# Patient Record
Sex: Female | Born: 1980 | Race: White | Hispanic: No | Marital: Married
Health system: Southern US, Community
[De-identification: ages and names within clinical notes are randomized; demographics above are authoritative.]

## PROBLEM LIST (undated history)

## (undated) HISTORY — PX: APPENDECTOMY: SHX54

---

## 2000-02-28 ENCOUNTER — Emergency Department (HOSPITAL_COMMUNITY): Admission: EM | Admit: 2000-02-28 | Discharge: 2000-02-28 | Payer: Self-pay | Admitting: Emergency Medicine

## 2004-11-19 ENCOUNTER — Other Ambulatory Visit: Admission: RE | Admit: 2004-11-19 | Discharge: 2004-11-19 | Payer: Self-pay | Admitting: Obstetrics and Gynecology

## 2005-05-25 ENCOUNTER — Inpatient Hospital Stay (HOSPITAL_COMMUNITY): Admission: AD | Admit: 2005-05-25 | Discharge: 2005-05-27 | Payer: Self-pay | Admitting: Obstetrics and Gynecology

## 2008-02-20 ENCOUNTER — Ambulatory Visit (HOSPITAL_COMMUNITY): Admission: RE | Admit: 2008-02-20 | Discharge: 2008-02-20 | Payer: Self-pay | Admitting: Family Medicine

## 2010-02-09 ENCOUNTER — Ambulatory Visit: Payer: Self-pay | Admitting: Plastic Surgery

## 2010-09-21 ENCOUNTER — Ambulatory Visit (HOSPITAL_COMMUNITY)
Admission: RE | Admit: 2010-09-21 | Discharge: 2010-09-21 | Payer: Self-pay | Source: Home / Self Care | Attending: Family Medicine | Admitting: Family Medicine

## 2011-02-19 NOTE — Discharge Summary (Signed)
NAMESHAKARIA, Donna Donovan             ACCOUNT NO.:  [id number]   MEDICAL RECORD NO.:  [id number]          PATIENT TYPE:  INP   LOCATION:  9117                          FACILITY:  [facility]   PHYSICIAN:  Huel Cote, M.D. DATE OF BIRTH:  21-Jan-1981   DATE OF ADMISSION:  05/25/2005  DATE OF DISCHARGE:  05/27/2005                                 DISCHARGE SUMMARY   DISCHARGE DIAGNOSES:  1.  Term pregnancy at 39+ weeks.  2.  Status post normal spontaneous vaginal delivery.  3.  Mild postpartum hemorrhage responding to Methergine.   DISCHARGE MEDICATIONS:  1.  Motrin 600 mg p.o. every six hours.  2.  Percocet one to two tablets p.o. every four hours p.r.n.   DISCHARGE FOLLOW-UP:  The patient will follow up in the office in  approximately six weeks for her routine postpartum exam.   The patient is a 30 year old G1, P0, who was admitted at 39+ weeks'  gestation for social induction given term status, favorable cervix, and her  husband working out of town in [place], [place], with a need to  schedule when he could be present.  Prenatal labs are as follows:  O  positive, antibody negative.  Rubella immune.  Hepatitis B surface antigen  negative.  HIV negative.  GC and Chlamydia negative.  One-hour Glucola 145,  three-hour Glucola normal.  Group B strep negative.   PAST OBSTETRICAL HISTORY:  None.   PAST GYNECOLOGIC HISTORY:  None.   PAST SURGICAL HISTORY:  Appendectomy in 1990.   PAST MEDICAL HISTORY:  None.  The patient had a history of smoking, however  quit with pregnancy.   PHYSICAL EXAMINATION:  She was afebrile with stable vital signs on  admission.  Fetal heart rate was reactive.  Cervix was soft, 50, 1-2 cm, and  a -2 station.   She had rupture of membranes performed with clear fluid noted.  She  progressed in labor throughout the day and reached complete dilation and  pushed well for approximately 20 minutes with a normal spontaneous vaginal  delivery of a vigorous  female infant over a small first degree laceration.  Apgars were 8 and 9.  Weight was 7 pounds 9 ounces.  Placenta delivered  spontaneously.  Uterus had moderate atony, which responded to bimanual  massage and Methergine x1 with an estimated blood loss of right at 500 mL.  Her laceration was repaired with 2-0  Vicryl and the cervix and rectum were intact.  On postpartum day #1 she was  doing quite well.  Her hemoglobin was 10.6.  Her fundus was firm and her  bleeding completely normal.  On postpartum day #2 she again was doing well  and was afebrile with stable vital signs and felt stable for discharge.      Huel Cote, M.D.  Electronically Signed     KR/MEDQ  D:  07/01/2005  T:  07/02/2005  Job:  [id number]

## 2012-10-04 HISTORY — PX: AUGMENTATION MAMMAPLASTY: SUR837

## 2016-11-08 DIAGNOSIS — Z1389 Encounter for screening for other disorder: Secondary | ICD-10-CM | POA: Diagnosis not present

## 2016-11-08 DIAGNOSIS — Z6829 Body mass index (BMI) 29.0-29.9, adult: Secondary | ICD-10-CM | POA: Diagnosis not present

## 2016-11-08 DIAGNOSIS — E663 Overweight: Secondary | ICD-10-CM | POA: Diagnosis not present

## 2016-11-08 DIAGNOSIS — F909 Attention-deficit hyperactivity disorder, unspecified type: Secondary | ICD-10-CM | POA: Diagnosis not present

## 2017-02-02 DIAGNOSIS — Z6830 Body mass index (BMI) 30.0-30.9, adult: Secondary | ICD-10-CM | POA: Diagnosis not present

## 2017-02-02 DIAGNOSIS — Z1389 Encounter for screening for other disorder: Secondary | ICD-10-CM | POA: Diagnosis not present

## 2017-02-02 DIAGNOSIS — F909 Attention-deficit hyperactivity disorder, unspecified type: Secondary | ICD-10-CM | POA: Diagnosis not present

## 2017-02-02 DIAGNOSIS — E6609 Other obesity due to excess calories: Secondary | ICD-10-CM | POA: Diagnosis not present

## 2017-05-16 DIAGNOSIS — F909 Attention-deficit hyperactivity disorder, unspecified type: Secondary | ICD-10-CM | POA: Diagnosis not present

## 2017-05-16 DIAGNOSIS — Z681 Body mass index (BMI) 19 or less, adult: Secondary | ICD-10-CM | POA: Diagnosis not present

## 2017-05-16 DIAGNOSIS — Z1389 Encounter for screening for other disorder: Secondary | ICD-10-CM | POA: Diagnosis not present

## 2017-08-17 DIAGNOSIS — F909 Attention-deficit hyperactivity disorder, unspecified type: Secondary | ICD-10-CM | POA: Diagnosis not present

## 2017-08-17 DIAGNOSIS — E6609 Other obesity due to excess calories: Secondary | ICD-10-CM | POA: Diagnosis not present

## 2017-08-17 DIAGNOSIS — Z6831 Body mass index (BMI) 31.0-31.9, adult: Secondary | ICD-10-CM | POA: Diagnosis not present

## 2017-10-10 DIAGNOSIS — Z1151 Encounter for screening for human papillomavirus (HPV): Secondary | ICD-10-CM | POA: Diagnosis not present

## 2017-10-10 DIAGNOSIS — Z30432 Encounter for removal of intrauterine contraceptive device: Secondary | ICD-10-CM | POA: Diagnosis not present

## 2017-10-10 DIAGNOSIS — Z6831 Body mass index (BMI) 31.0-31.9, adult: Secondary | ICD-10-CM | POA: Diagnosis not present

## 2017-10-10 DIAGNOSIS — Z01419 Encounter for gynecological examination (general) (routine) without abnormal findings: Secondary | ICD-10-CM | POA: Diagnosis not present

## 2017-10-10 DIAGNOSIS — Z1389 Encounter for screening for other disorder: Secondary | ICD-10-CM | POA: Diagnosis not present

## 2017-10-10 DIAGNOSIS — Z124 Encounter for screening for malignant neoplasm of cervix: Secondary | ICD-10-CM | POA: Diagnosis not present

## 2017-10-10 DIAGNOSIS — Z130 Encounter for screening for diseases of the blood and blood-forming organs and certain disorders involving the immune mechanism: Secondary | ICD-10-CM | POA: Diagnosis not present

## 2017-10-10 DIAGNOSIS — Z30430 Encounter for insertion of intrauterine contraceptive device: Secondary | ICD-10-CM | POA: Diagnosis not present

## 2017-10-17 DIAGNOSIS — Z1389 Encounter for screening for other disorder: Secondary | ICD-10-CM | POA: Diagnosis not present

## 2017-10-17 DIAGNOSIS — M545 Low back pain: Secondary | ICD-10-CM | POA: Diagnosis not present

## 2017-10-17 DIAGNOSIS — Z681 Body mass index (BMI) 19 or less, adult: Secondary | ICD-10-CM | POA: Diagnosis not present

## 2017-10-17 DIAGNOSIS — M5410 Radiculopathy, site unspecified: Secondary | ICD-10-CM | POA: Diagnosis not present

## 2017-11-15 DIAGNOSIS — Z30431 Encounter for routine checking of intrauterine contraceptive device: Secondary | ICD-10-CM | POA: Diagnosis not present

## 2017-11-19 DIAGNOSIS — H1013 Acute atopic conjunctivitis, bilateral: Secondary | ICD-10-CM | POA: Diagnosis not present

## 2017-11-19 DIAGNOSIS — H40033 Anatomical narrow angle, bilateral: Secondary | ICD-10-CM | POA: Diagnosis not present

## 2017-11-28 DIAGNOSIS — Z6833 Body mass index (BMI) 33.0-33.9, adult: Secondary | ICD-10-CM | POA: Diagnosis not present

## 2017-11-28 DIAGNOSIS — F909 Attention-deficit hyperactivity disorder, unspecified type: Secondary | ICD-10-CM | POA: Diagnosis not present

## 2017-11-28 DIAGNOSIS — Z1389 Encounter for screening for other disorder: Secondary | ICD-10-CM | POA: Diagnosis not present

## 2017-11-28 DIAGNOSIS — E6609 Other obesity due to excess calories: Secondary | ICD-10-CM | POA: Diagnosis not present

## 2018-03-09 DIAGNOSIS — F909 Attention-deficit hyperactivity disorder, unspecified type: Secondary | ICD-10-CM | POA: Diagnosis not present

## 2018-03-09 DIAGNOSIS — Z6832 Body mass index (BMI) 32.0-32.9, adult: Secondary | ICD-10-CM | POA: Diagnosis not present

## 2018-03-09 DIAGNOSIS — E6609 Other obesity due to excess calories: Secondary | ICD-10-CM | POA: Diagnosis not present

## 2018-03-09 DIAGNOSIS — Z1389 Encounter for screening for other disorder: Secondary | ICD-10-CM | POA: Diagnosis not present

## 2018-06-08 DIAGNOSIS — F909 Attention-deficit hyperactivity disorder, unspecified type: Secondary | ICD-10-CM | POA: Diagnosis not present

## 2018-06-08 DIAGNOSIS — Z6833 Body mass index (BMI) 33.0-33.9, adult: Secondary | ICD-10-CM | POA: Diagnosis not present

## 2018-06-08 DIAGNOSIS — E6609 Other obesity due to excess calories: Secondary | ICD-10-CM | POA: Diagnosis not present

## 2018-06-08 DIAGNOSIS — Z1389 Encounter for screening for other disorder: Secondary | ICD-10-CM | POA: Diagnosis not present

## 2018-10-27 DIAGNOSIS — Z681 Body mass index (BMI) 19 or less, adult: Secondary | ICD-10-CM | POA: Diagnosis not present

## 2018-10-27 DIAGNOSIS — Z1389 Encounter for screening for other disorder: Secondary | ICD-10-CM | POA: Diagnosis not present

## 2018-10-27 DIAGNOSIS — F909 Attention-deficit hyperactivity disorder, unspecified type: Secondary | ICD-10-CM | POA: Diagnosis not present

## 2019-02-08 DIAGNOSIS — F909 Attention-deficit hyperactivity disorder, unspecified type: Secondary | ICD-10-CM | POA: Diagnosis not present

## 2019-05-16 DIAGNOSIS — Z6833 Body mass index (BMI) 33.0-33.9, adult: Secondary | ICD-10-CM | POA: Diagnosis not present

## 2019-05-16 DIAGNOSIS — F909 Attention-deficit hyperactivity disorder, unspecified type: Secondary | ICD-10-CM | POA: Diagnosis not present

## 2019-05-16 DIAGNOSIS — M5410 Radiculopathy, site unspecified: Secondary | ICD-10-CM | POA: Diagnosis not present

## 2019-09-03 DIAGNOSIS — F909 Attention-deficit hyperactivity disorder, unspecified type: Secondary | ICD-10-CM | POA: Diagnosis not present

## 2019-09-17 DIAGNOSIS — T8549XA Other mechanical complication of breast prosthesis and implant, initial encounter: Secondary | ICD-10-CM | POA: Diagnosis not present

## 2019-09-18 ENCOUNTER — Other Ambulatory Visit: Payer: Self-pay | Admitting: Obstetrics and Gynecology

## 2019-09-18 DIAGNOSIS — T8549XA Other mechanical complication of breast prosthesis and implant, initial encounter: Secondary | ICD-10-CM

## 2019-09-21 ENCOUNTER — Other Ambulatory Visit: Payer: Self-pay | Admitting: Obstetrics and Gynecology

## 2019-09-21 DIAGNOSIS — T8543XA Leakage of breast prosthesis and implant, initial encounter: Secondary | ICD-10-CM

## 2019-09-21 DIAGNOSIS — T8549XA Other mechanical complication of breast prosthesis and implant, initial encounter: Secondary | ICD-10-CM

## 2019-09-25 ENCOUNTER — Ambulatory Visit: Admission: RE | Admit: 2019-09-25 | Payer: Self-pay | Source: Ambulatory Visit

## 2019-09-25 ENCOUNTER — Other Ambulatory Visit: Payer: Self-pay | Admitting: Obstetrics and Gynecology

## 2019-09-25 ENCOUNTER — Other Ambulatory Visit: Payer: Self-pay

## 2019-09-25 ENCOUNTER — Ambulatory Visit
Admission: RE | Admit: 2019-09-25 | Discharge: 2019-09-25 | Disposition: A | Discharge status: Home / Self Care | Payer: BC Managed Care – PPO | Source: Ambulatory Visit | Attending: Obstetrics and Gynecology | Admitting: Obstetrics and Gynecology

## 2019-09-25 DIAGNOSIS — T8543XA Leakage of breast prosthesis and implant, initial encounter: Secondary | ICD-10-CM

## 2019-09-25 DIAGNOSIS — R928 Other abnormal and inconclusive findings on diagnostic imaging of breast: Secondary | ICD-10-CM | POA: Diagnosis not present

## 2019-09-25 DIAGNOSIS — T8549XA Other mechanical complication of breast prosthesis and implant, initial encounter: Secondary | ICD-10-CM

## 2020-10-16 IMAGING — MG MM  DIGITAL DIAGNOSTIC BREAST BILAT IMPLANT W/ TOMO W/ CAD
8 of 12 series · 8 of 28 positions shown · non-contrast
Comparison: None.

CLINICAL DATA: 38-year-old female with possible right breast
implant rupture.

EXAM:
2D DIGITAL DIAGNOSTIC BILATERAL MAMMOGRAM WITH IMPLANTS, CAD AND
ADJUNCT TOMO
The patient has retroglandular implants. Standard and implant
displaced views were performed.

[R MLO]
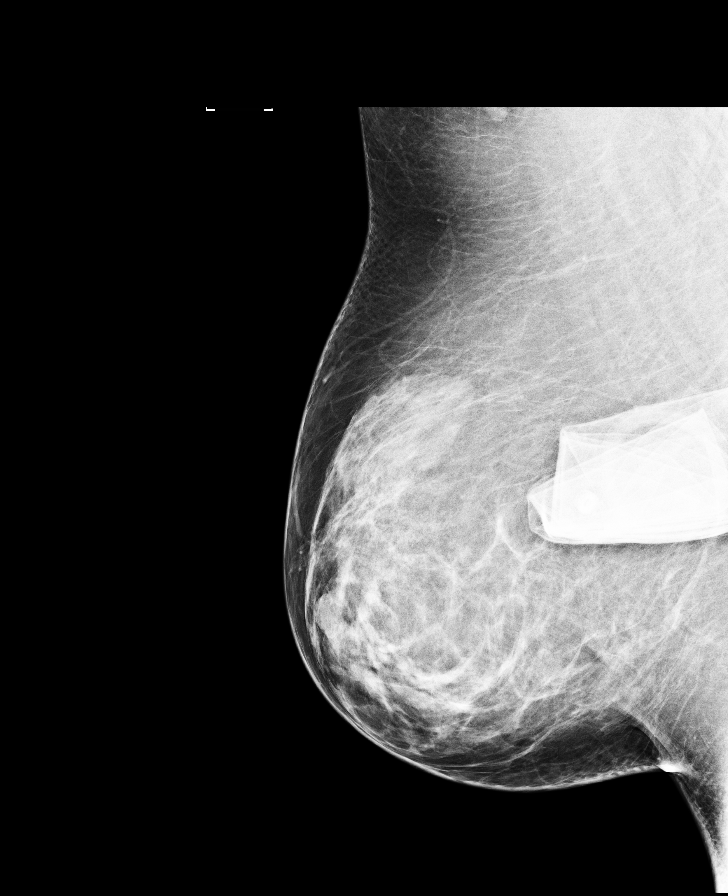

[R CC]
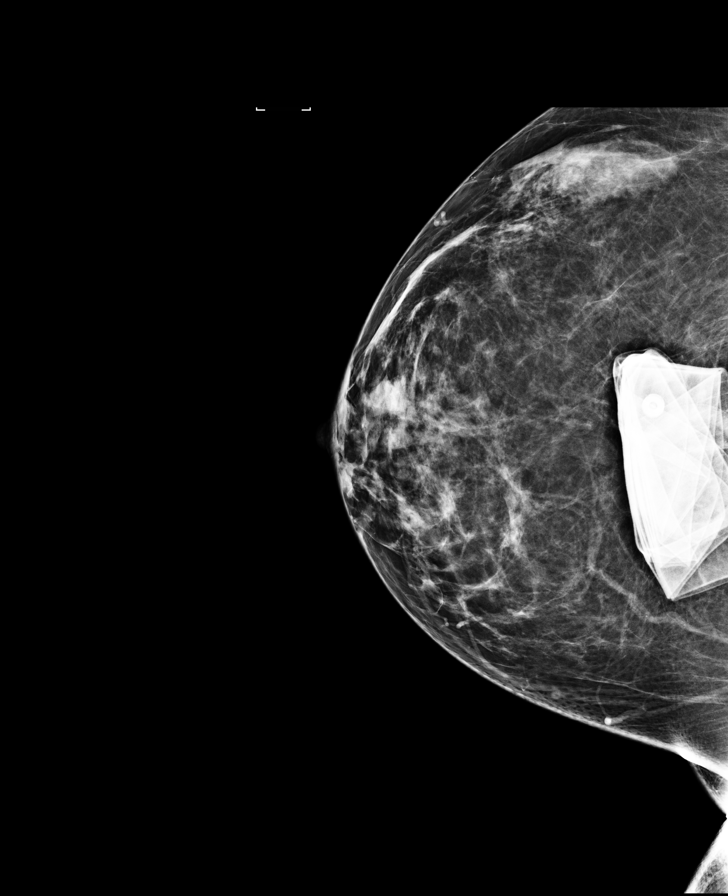

[L MLO]
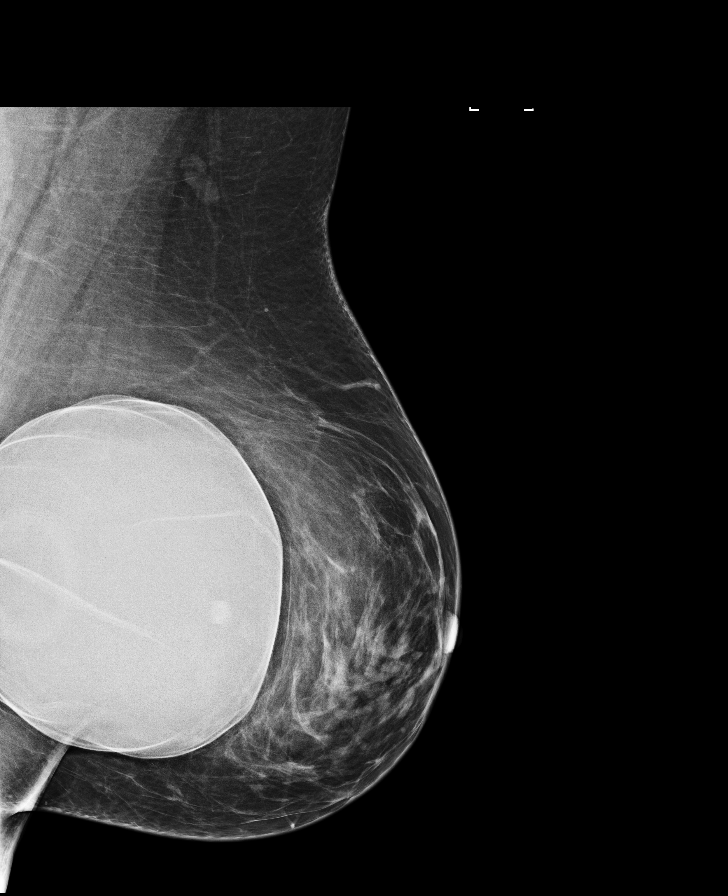

[L CC]
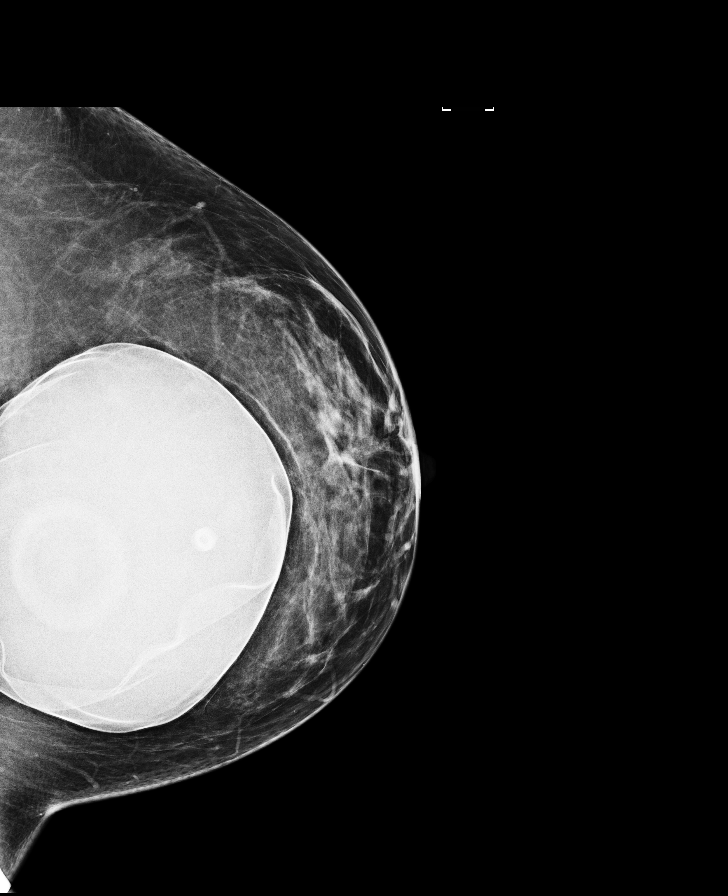

[R MLO synth-2D]
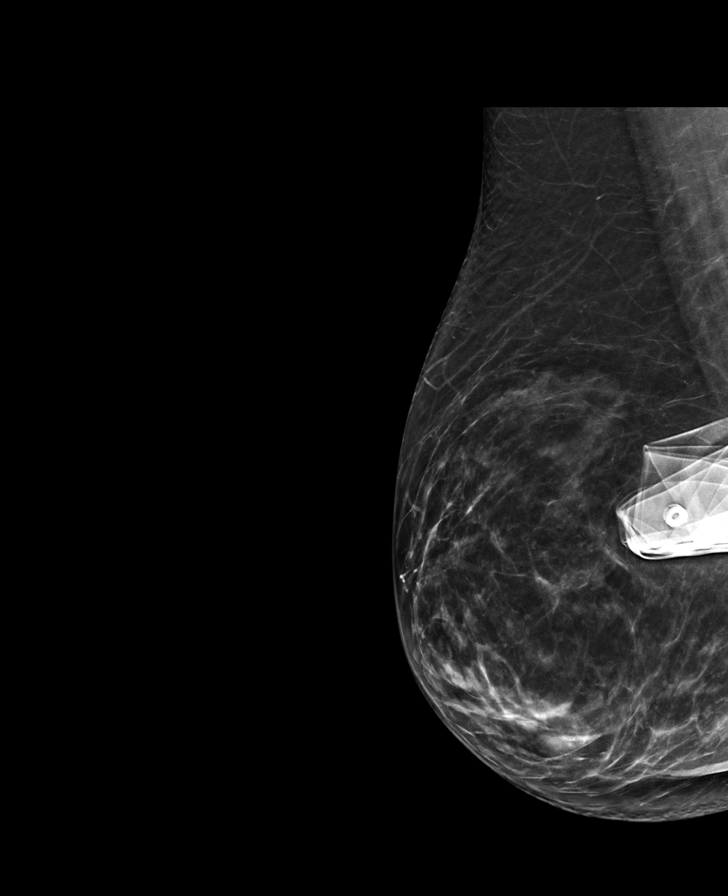

[L MLO synth-2D]
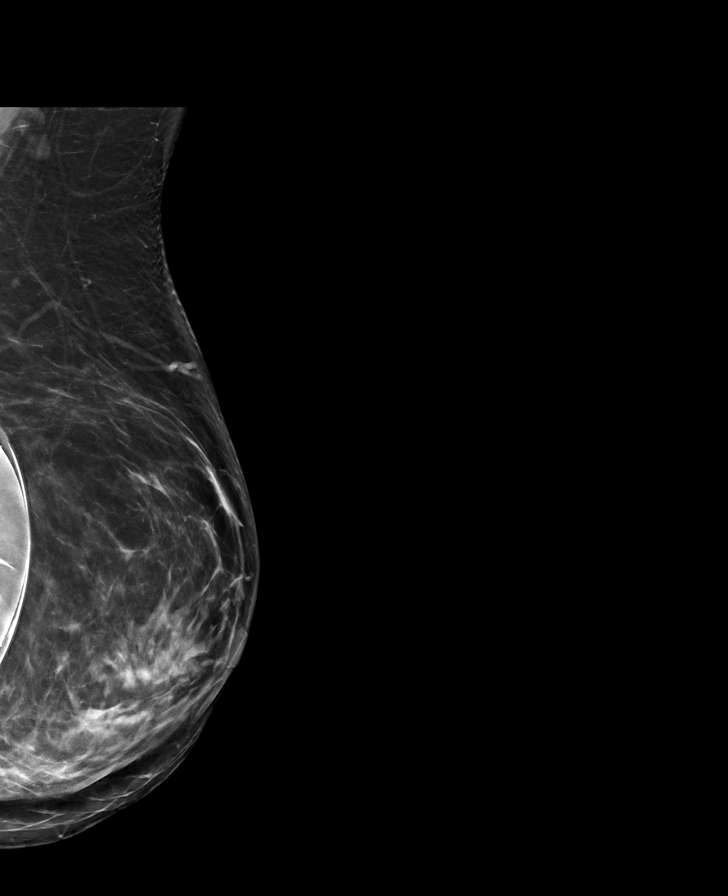

[R CC synth-2D]
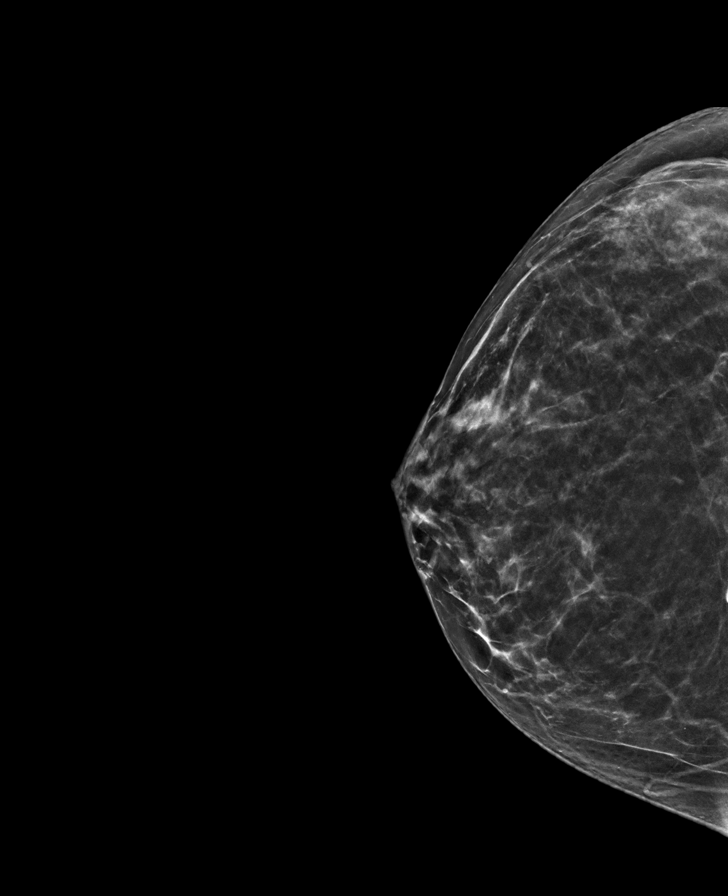

[L CC synth-2D]
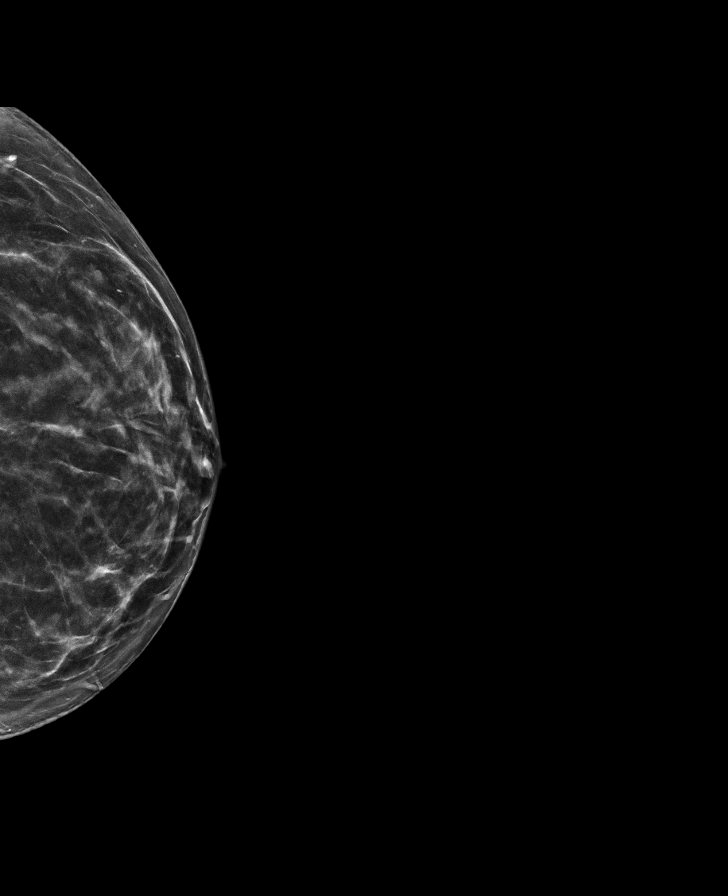

[8 of 28 positions shown; findings below may reference images not displayed]

ACR Breast Density Category b: There are scattered areas of
fibroglandular density.
FINDINGS: The right implant is deflated and ruptured. No suspicious mass or
malignant type microcalcifications identified in either breast.

Mammographic images were processed with CAD.
IMPRESSION: No evidence of malignancy in either breast. The patient's right
breast implant is ruptured.

RECOMMENDATION:
Bilateral screening mammogram in 1 year is recommended.

I have discussed the findings and recommendations with the patient.
If applicable, a reminder letter will be sent to the patient
regarding the next appointment.

BI-RADS CATEGORY  2: Benign.

## 2020-10-30 DIAGNOSIS — U071 COVID-19: Secondary | ICD-10-CM | POA: Diagnosis not present

## 2020-10-30 DIAGNOSIS — F909 Attention-deficit hyperactivity disorder, unspecified type: Secondary | ICD-10-CM | POA: Diagnosis not present

## 2021-02-13 DIAGNOSIS — Z1331 Encounter for screening for depression: Secondary | ICD-10-CM | POA: Diagnosis not present

## 2021-02-13 DIAGNOSIS — S39012A Strain of muscle, fascia and tendon of lower back, initial encounter: Secondary | ICD-10-CM | POA: Diagnosis not present

## 2021-02-13 DIAGNOSIS — F909 Attention-deficit hyperactivity disorder, unspecified type: Secondary | ICD-10-CM | POA: Diagnosis not present

## 2021-02-13 DIAGNOSIS — Z6837 Body mass index (BMI) 37.0-37.9, adult: Secondary | ICD-10-CM | POA: Diagnosis not present

## 2021-02-13 DIAGNOSIS — D485 Neoplasm of uncertain behavior of skin: Secondary | ICD-10-CM | POA: Diagnosis not present

## 2021-05-11 ENCOUNTER — Emergency Department (HOSPITAL_COMMUNITY)
Admission: EM | Admit: 2021-05-11 | Discharge: 2021-05-11 | Disposition: A | Discharge status: Home / Self Care | Payer: BC Managed Care – PPO | Attending: Emergency Medicine | Admitting: Emergency Medicine

## 2021-05-11 ENCOUNTER — Emergency Department (HOSPITAL_COMMUNITY): Payer: BC Managed Care – PPO

## 2021-05-11 ENCOUNTER — Other Ambulatory Visit: Payer: Self-pay

## 2021-05-11 ENCOUNTER — Encounter (HOSPITAL_COMMUNITY): Payer: Self-pay | Admitting: *Deleted

## 2021-05-11 DIAGNOSIS — S93402A Sprain of unspecified ligament of left ankle, initial encounter: Secondary | ICD-10-CM | POA: Insufficient documentation

## 2021-05-11 DIAGNOSIS — M7989 Other specified soft tissue disorders: Secondary | ICD-10-CM | POA: Diagnosis not present

## 2021-05-11 DIAGNOSIS — S99912A Unspecified injury of left ankle, initial encounter: Secondary | ICD-10-CM | POA: Diagnosis not present

## 2021-05-11 DIAGNOSIS — W172XXA Fall into hole, initial encounter: Secondary | ICD-10-CM | POA: Insufficient documentation

## 2021-05-11 DIAGNOSIS — Z87891 Personal history of nicotine dependence: Secondary | ICD-10-CM | POA: Diagnosis not present

## 2021-05-11 DIAGNOSIS — M25572 Pain in left ankle and joints of left foot: Secondary | ICD-10-CM | POA: Diagnosis not present

## 2021-05-11 NOTE — ED Provider Notes (Signed)
[facility] EMERGENCY DEPARTMENT Provider Note   CSN: [id number] Arrival date & time: 05/11/21  1252     History Chief Complaint  Patient presents with   Ankle Pain    Left     Donna Donovan is a 40 y.o. female.  Donna Donovan is a 40 yo female who presents with left ankle pain following a fall in a hole earlier this morning. 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 She says she heard a crackle at this time and felt pain that radiated up her left lateral leg. She denies redness or obvious swelling, and states she has been able to walk on her ankle, although it does cause her significant pain. She has not taken any pain medication today      History reviewed. No pertinent past medical history.  There are no problems to display for this patient.   Past Surgical History:  Procedure Laterality Date   APPENDECTOMY     AUGMENTATION MAMMAPLASTY Bilateral 2014     OB History   No obstetric history on file.     History reviewed. No pertinent family history.  Social History   Tobacco Use   Smoking status: Former    Types: Cigarettes   Smokeless tobacco: Never  Vaping Use   Vaping Use: Never used  Substance Use Topics   Alcohol use: Yes    Comment: occaasionally   Drug use: Not Currently    Home Medications Prior to Admission medications   Not on File    Allergies    Patient has no known allergies.  Review of Systems   Review of Systems  Constitutional:  Negative for fever.  Musculoskeletal:  Positive for arthralgias, gait problem and joint swelling.  Skin:  Negative for color change, rash and wound.  Allergic/Immunologic: Negative for immunocompromised state.  Neurological:  Negative for weakness and numbness.  Hematological:  Does not bruise/bleed easily.  Psychiatric/Behavioral:  Negative for confusion.   All other systems reviewed and are negative.  Physical Exam Updated Vital  Signs BP (!) 146/111 (BP Location: Right Arm)   Pulse 94   Temp 98.4 F (36.9 C) (Oral)   Resp 20   Ht 5\' 5"  (1.651 m)   Wt 102.1 kg   LMP  (LMP Unknown) Comment: IUD  SpO2 98%   BMI 37.44 kg/m   Physical Exam Vitals and nursing note reviewed.  Constitutional:      General: She is not in acute distress.    Appearance: She is well-developed. She is not diaphoretic.  HENT:     Head: Normocephalic and atraumatic.  Cardiovascular:     Pulses: Normal pulses.  Pulmonary:     Effort: Pulmonary effort is normal.  Musculoskeletal:        General: Swelling and tenderness present. No deformity.     Left ankle: Tenderness present over the lateral malleolus and ATF ligament. No base of 5th metatarsal or proximal fibula tenderness. Normal pulse.  Skin:    General: Skin is warm and dry.     Findings: No erythema or rash.  Neurological:     Mental Status: She is alert and oriented to person, place, and time.     Sensory: No sensory deficit.     Motor: No weakness.  Psychiatric:        Behavior: Behavior normal.    ED Results / Procedures / Treatments   Labs (all  labs ordered are listed, but only abnormal results are displayed) Labs Reviewed - No data to display  EKG None  Radiology DG Ankle Complete Left  Result Date: 05/11/2021 CLINICAL DATA:  Fall, left ankle pain EXAM: LEFT ANKLE COMPLETE - 3+ VIEW COMPARISON:  None. FINDINGS: There is no evidence of fracture, dislocation, or joint effusion. There is no evidence of arthropathy or other focal bone abnormality. Mild soft tissue swelling about the ankle. IMPRESSION: No fracture or dislocation of the left ankle. Mild soft tissue swelling. Electronically Signed   By: Duanne Guess D.O.   On: 05/11/2021 14:20    Procedures Procedures   Medications Ordered in ED Medications - No data to display  ED Course  I have reviewed the triage vital signs and the nursing notes.  Pertinent labs & imaging results that were available  during my care of the patient were reviewed by me and considered in my medical decision making (see chart for details).  Clinical Course as of 05/11/21 1510  Mon May 11, 2021  5827 40 year old female with complaint of left ankle injury as above.  Found to have lateral left ankle tenderness.  DP pulse present, sensation intact.  No pain at fifth metatarsal or proximal fibula.  X-ray shows soft tissue swelling, negative for fracture.  Plan is to place an ankle ASO, given crutches and may weight-bear as tolerated.  Recommend ice, elevation, Motrin Tylenol as needed as directed, follow-up with orthopedics office in 1 week if not improving. [LM]    Clinical Course User Index [LM] Alden Hipp   MDM Rules/Calculators/A&P                           Final Clinical Impression(s) / ED Diagnoses Final diagnoses:  Sprain of left ankle, unspecified ligament, initial encounter    Rx / DC Orders ED Discharge Orders     None        Alden Hipp 05/11/21 1510    Cheryll Cockayne, MD 05/16/21 1511

## 2021-05-11 NOTE — ED Triage Notes (Signed)
Pt states she fell in a hole this am injuring her left ankle; pt states the pain radiates up to her knee

## 2021-05-27 DIAGNOSIS — S93402D Sprain of unspecified ligament of left ankle, subsequent encounter: Secondary | ICD-10-CM | POA: Diagnosis not present

## 2021-05-27 DIAGNOSIS — F909 Attention-deficit hyperactivity disorder, unspecified type: Secondary | ICD-10-CM | POA: Diagnosis not present

## 2021-05-27 DIAGNOSIS — Z681 Body mass index (BMI) 19 or less, adult: Secondary | ICD-10-CM | POA: Diagnosis not present

## 2021-06-17 DIAGNOSIS — C4359 Malignant melanoma of other part of trunk: Secondary | ICD-10-CM | POA: Diagnosis not present

## 2021-07-13 DIAGNOSIS — D0359 Melanoma in situ of other part of trunk: Secondary | ICD-10-CM | POA: Diagnosis not present

## 2021-07-13 DIAGNOSIS — C4359 Malignant melanoma of other part of trunk: Secondary | ICD-10-CM | POA: Diagnosis not present

## 2021-08-24 DIAGNOSIS — C439 Malignant melanoma of skin, unspecified: Secondary | ICD-10-CM | POA: Diagnosis not present

## 2021-08-24 DIAGNOSIS — F909 Attention-deficit hyperactivity disorder, unspecified type: Secondary | ICD-10-CM | POA: Diagnosis not present

## 2021-12-16 DIAGNOSIS — F909 Attention-deficit hyperactivity disorder, unspecified type: Secondary | ICD-10-CM | POA: Diagnosis not present

## 2021-12-30 DIAGNOSIS — Z0000 Encounter for general adult medical examination without abnormal findings: Secondary | ICD-10-CM | POA: Diagnosis not present

## 2021-12-30 DIAGNOSIS — Z139 Encounter for screening, unspecified: Secondary | ICD-10-CM | POA: Diagnosis not present

## 2022-05-21 DIAGNOSIS — F419 Anxiety disorder, unspecified: Secondary | ICD-10-CM | POA: Diagnosis not present

## 2022-05-21 DIAGNOSIS — F909 Attention-deficit hyperactivity disorder, unspecified type: Secondary | ICD-10-CM | POA: Diagnosis not present

## 2022-05-21 DIAGNOSIS — Z1389 Encounter for screening for other disorder: Secondary | ICD-10-CM | POA: Diagnosis not present

## 2022-06-02 IMAGING — DX DG ANKLE COMPLETE 3+V*L*
3 series · 3 of 3 positions shown · non-contrast
Comparison: None.

CLINICAL DATA: Fall, left ankle pain

EXAM:
LEFT ANKLE COMPLETE - 3+ VIEW

[ankle ap]
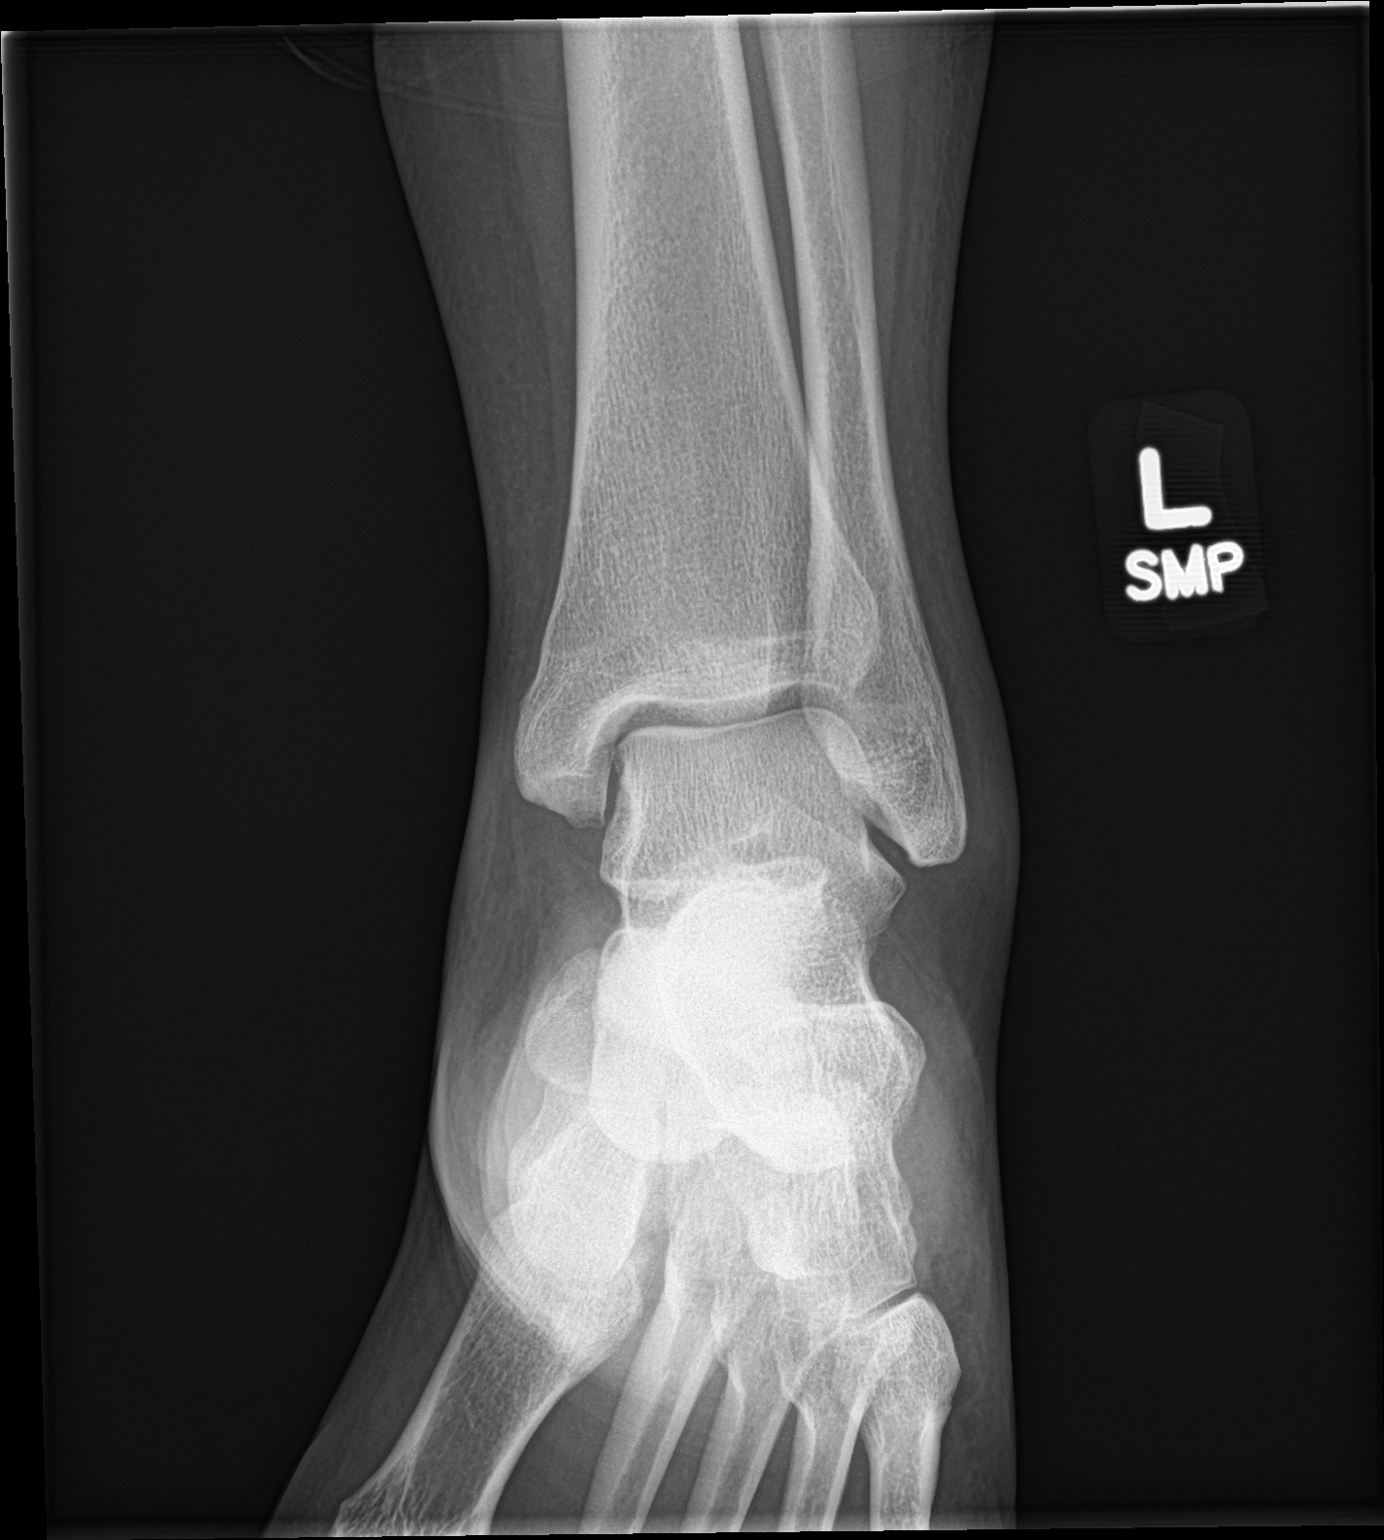

[ankle obl]
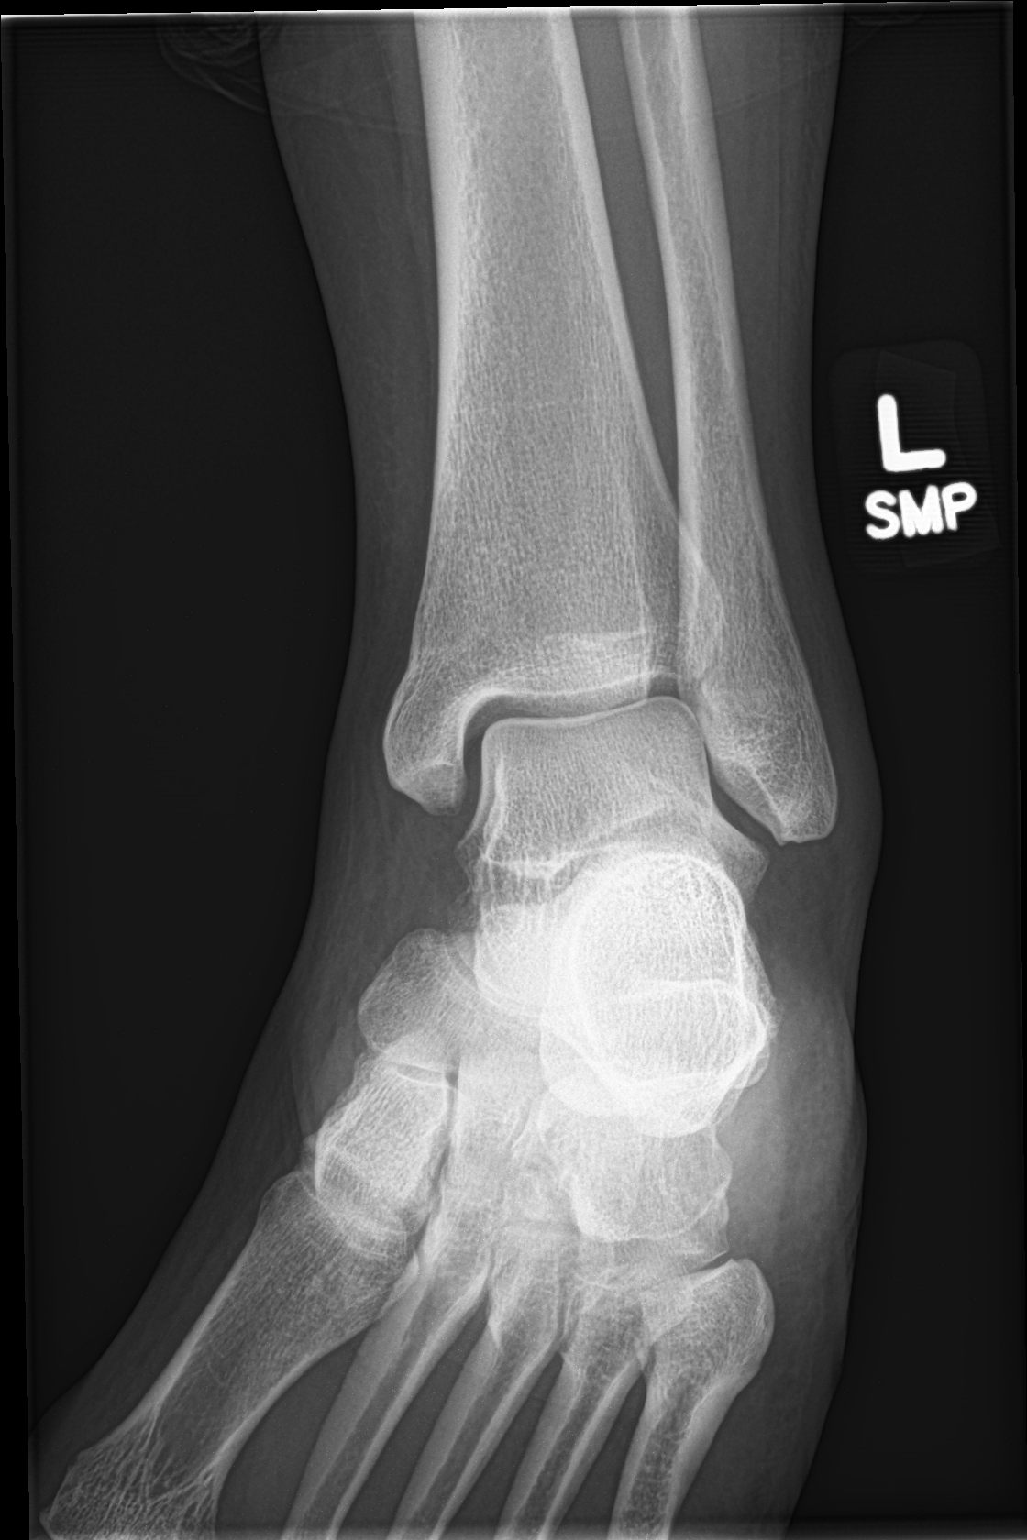

[ankle lat]
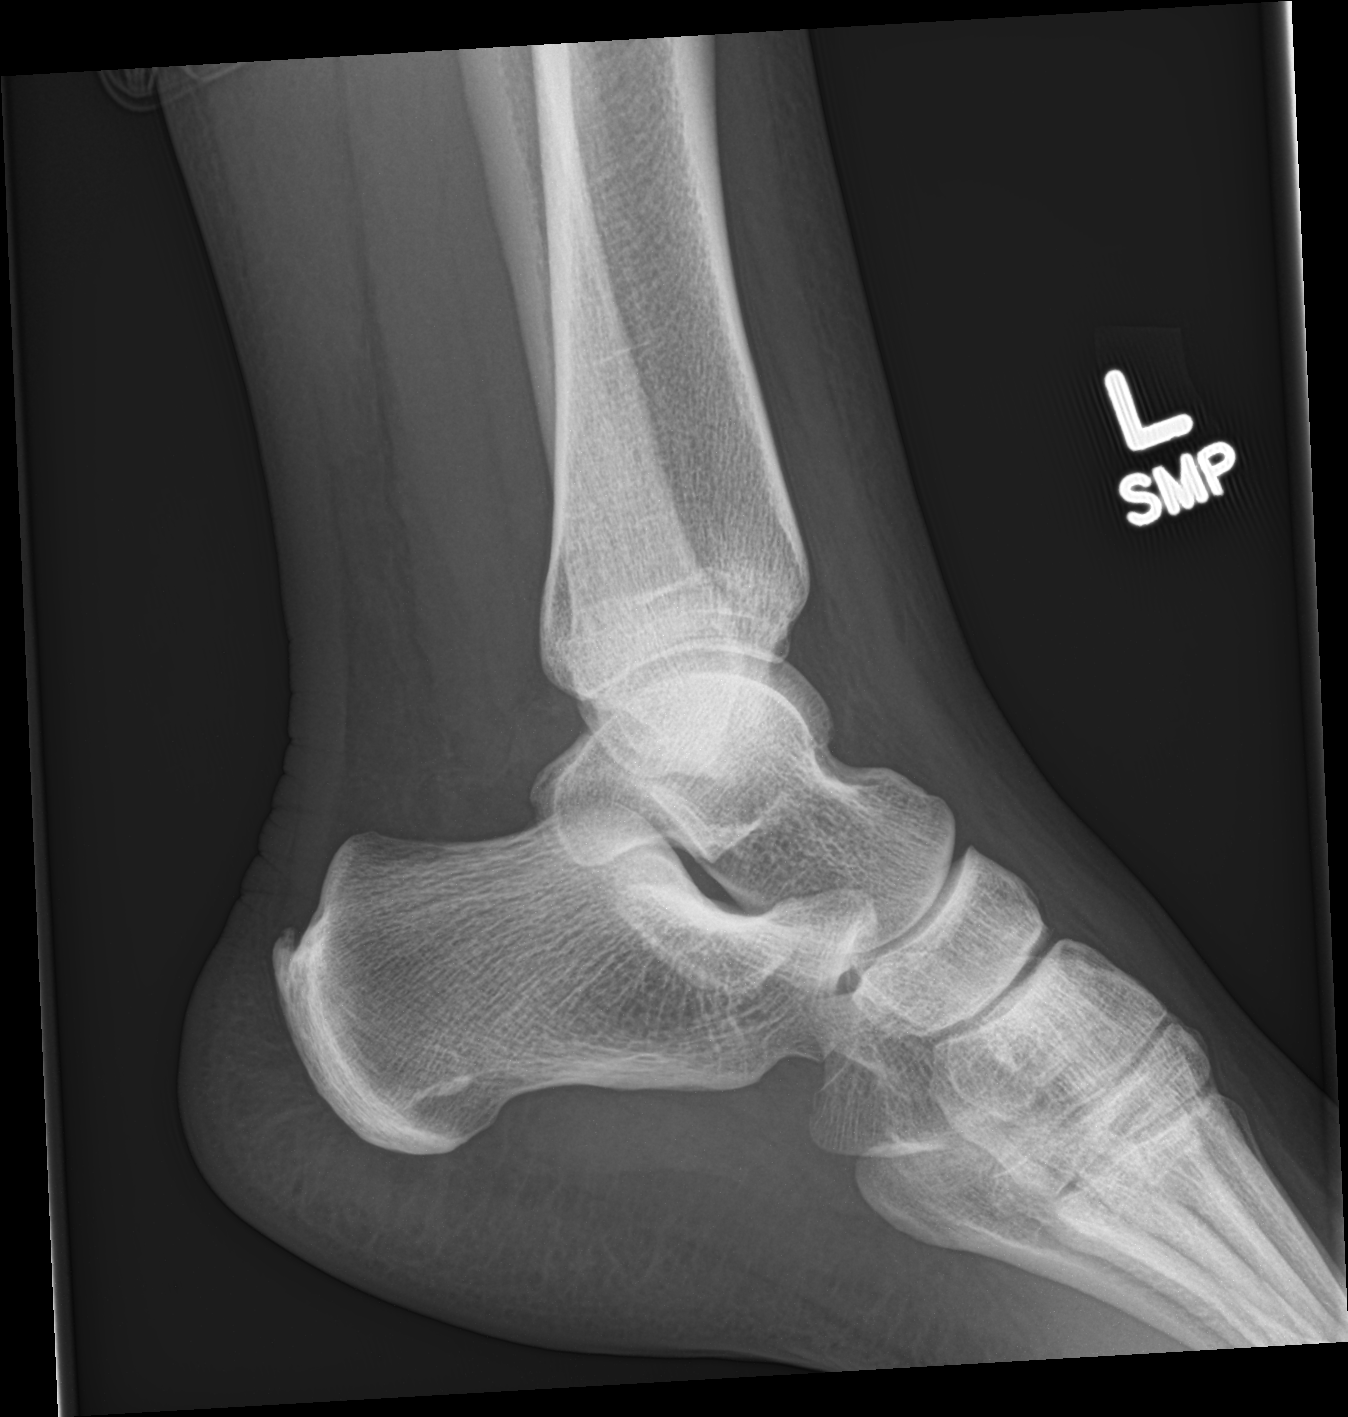

[3 of 3 positions shown; findings below may reference images not displayed]

FINDINGS: There is no evidence of fracture, dislocation, or joint effusion.
There is no evidence of arthropathy or other focal bone abnormality.
Mild soft tissue swelling about the ankle.
IMPRESSION: No fracture or dislocation of the left ankle. Mild soft tissue
swelling.

## 2022-08-24 DIAGNOSIS — M7711 Lateral epicondylitis, right elbow: Secondary | ICD-10-CM | POA: Diagnosis not present

## 2022-08-24 DIAGNOSIS — F909 Attention-deficit hyperactivity disorder, unspecified type: Secondary | ICD-10-CM | POA: Diagnosis not present

## 2023-01-10 DIAGNOSIS — L259 Unspecified contact dermatitis, unspecified cause: Secondary | ICD-10-CM | POA: Diagnosis not present

## 2023-01-13 DIAGNOSIS — Z0001 Encounter for general adult medical examination with abnormal findings: Secondary | ICD-10-CM | POA: Diagnosis not present

## 2023-01-13 DIAGNOSIS — Z1331 Encounter for screening for depression: Secondary | ICD-10-CM | POA: Diagnosis not present

## 2023-01-13 DIAGNOSIS — L239 Allergic contact dermatitis, unspecified cause: Secondary | ICD-10-CM | POA: Diagnosis not present

## 2023-01-13 DIAGNOSIS — Z6827 Body mass index (BMI) 27.0-27.9, adult: Secondary | ICD-10-CM | POA: Diagnosis not present

## 2023-01-13 DIAGNOSIS — F419 Anxiety disorder, unspecified: Secondary | ICD-10-CM | POA: Diagnosis not present

## 2023-03-28 DIAGNOSIS — R4182 Altered mental status, unspecified: Secondary | ICD-10-CM | POA: Diagnosis not present

## 2023-03-28 DIAGNOSIS — F10129 Alcohol abuse with intoxication, unspecified: Secondary | ICD-10-CM | POA: Diagnosis not present

## 2023-08-05 DIAGNOSIS — M7660 Achilles tendinitis, unspecified leg: Secondary | ICD-10-CM | POA: Diagnosis not present

## 2023-08-05 DIAGNOSIS — F909 Attention-deficit hyperactivity disorder, unspecified type: Secondary | ICD-10-CM | POA: Diagnosis not present

## 2023-12-01 DIAGNOSIS — F909 Attention-deficit hyperactivity disorder, unspecified type: Secondary | ICD-10-CM | POA: Diagnosis not present

## 2023-12-01 DIAGNOSIS — F419 Anxiety disorder, unspecified: Secondary | ICD-10-CM | POA: Diagnosis not present

## 2023-12-05 DIAGNOSIS — A084 Viral intestinal infection, unspecified: Secondary | ICD-10-CM | POA: Diagnosis not present

## 2023-12-05 DIAGNOSIS — R109 Unspecified abdominal pain: Secondary | ICD-10-CM | POA: Diagnosis not present

## 2024-03-01 DIAGNOSIS — L905 Scar conditions and fibrosis of skin: Secondary | ICD-10-CM | POA: Diagnosis not present

## 2024-03-01 DIAGNOSIS — Z1283 Encounter for screening for malignant neoplasm of skin: Secondary | ICD-10-CM | POA: Diagnosis not present

## 2024-03-01 DIAGNOSIS — Z08 Encounter for follow-up examination after completed treatment for malignant neoplasm: Secondary | ICD-10-CM | POA: Diagnosis not present

## 2024-03-01 DIAGNOSIS — D225 Melanocytic nevi of trunk: Secondary | ICD-10-CM | POA: Diagnosis not present

## 2024-03-01 DIAGNOSIS — D485 Neoplasm of uncertain behavior of skin: Secondary | ICD-10-CM | POA: Diagnosis not present

## 2024-03-01 DIAGNOSIS — Z85820 Personal history of malignant melanoma of skin: Secondary | ICD-10-CM | POA: Diagnosis not present

## 2024-04-12 DIAGNOSIS — L988 Other specified disorders of the skin and subcutaneous tissue: Secondary | ICD-10-CM | POA: Diagnosis not present

## 2024-04-12 DIAGNOSIS — D485 Neoplasm of uncertain behavior of skin: Secondary | ICD-10-CM | POA: Diagnosis not present

## 2024-05-03 DIAGNOSIS — F909 Attention-deficit hyperactivity disorder, unspecified type: Secondary | ICD-10-CM | POA: Diagnosis not present

## 2024-05-03 DIAGNOSIS — E6609 Other obesity due to excess calories: Secondary | ICD-10-CM | POA: Diagnosis not present

## 2024-05-03 DIAGNOSIS — Z6838 Body mass index (BMI) 38.0-38.9, adult: Secondary | ICD-10-CM | POA: Diagnosis not present

## 2024-09-06 DIAGNOSIS — D485 Neoplasm of uncertain behavior of skin: Secondary | ICD-10-CM | POA: Diagnosis not present

## 2024-09-06 DIAGNOSIS — Z08 Encounter for follow-up examination after completed treatment for malignant neoplasm: Secondary | ICD-10-CM | POA: Diagnosis not present

## 2024-09-06 DIAGNOSIS — D2272 Melanocytic nevi of left lower limb, including hip: Secondary | ICD-10-CM | POA: Diagnosis not present

## 2024-09-06 DIAGNOSIS — I8312 Varicose veins of left lower extremity with inflammation: Secondary | ICD-10-CM | POA: Diagnosis not present

## 2024-09-06 DIAGNOSIS — Z85820 Personal history of malignant melanoma of skin: Secondary | ICD-10-CM | POA: Diagnosis not present

## 2024-09-06 DIAGNOSIS — D225 Melanocytic nevi of trunk: Secondary | ICD-10-CM | POA: Diagnosis not present
